# Patient Record
Sex: Male | Born: 1990 | Race: White | Hispanic: No | Marital: Married | Smoking: Current every day smoker
Health system: Southern US, Community
[De-identification: ages and names within clinical notes are randomized; demographics above are authoritative.]

---

## 2017-05-15 ENCOUNTER — Encounter (HOSPITAL_BASED_OUTPATIENT_CLINIC_OR_DEPARTMENT_OTHER): Payer: Self-pay | Admitting: Emergency Medicine

## 2017-05-15 ENCOUNTER — Emergency Department (HOSPITAL_BASED_OUTPATIENT_CLINIC_OR_DEPARTMENT_OTHER)
Admission: EM | Admit: 2017-05-15 | Discharge: 2017-05-15 | Disposition: A | Discharge status: Home / Self Care | Payer: Commercial Managed Care - PPO | Attending: Emergency Medicine | Admitting: Emergency Medicine

## 2017-05-15 DIAGNOSIS — L0231 Cutaneous abscess of buttock: Secondary | ICD-10-CM | POA: Insufficient documentation

## 2017-05-15 DIAGNOSIS — Z79899 Other long term (current) drug therapy: Secondary | ICD-10-CM | POA: Diagnosis not present

## 2017-05-15 DIAGNOSIS — F17200 Nicotine dependence, unspecified, uncomplicated: Secondary | ICD-10-CM | POA: Diagnosis not present

## 2017-05-15 MED ORDER — LIDOCAINE-EPINEPHRINE 2 %-1:100000 IJ SOLN
20.0000 mL | Freq: Once | INTRAMUSCULAR | Status: AC
  Administered 2017-05-15: 20 mL
  Filled 2017-05-15: qty 1

## 2017-05-15 NOTE — Discharge Instructions (Signed)
Warm compresses or soaks 4x a day and after bowel movement. Take abx until gone.

## 2017-05-15 NOTE — ED Triage Notes (Addendum)
Abscess to top of buttock, seen at [facility] and told told that it needed to be drained. Pt also started on abx.

## 2017-05-15 NOTE — ED Provider Notes (Signed)
[facility]   CSN: [id number] Arrival date & time: 05/15/17  0816     History   Chief Complaint Chief Complaint  Patient presents with  . Abscess    HPI Ryan Fischer is a 26 y.o. male.  26 yo M with a chief complaint of a abscess to his right buttock. Going on for the past week. Was seen in urgent care yesterday and they're concerned for a pilonidal abscess and told he needed to come to the emergency department for possible imaging or surgical consult. He has had a low-grade temperature yesterday. Otherwise denies fevers or nausea. The started warm compresses but felt like it made it worse.   The history is provided by the patient.  Abscess  Location:  Pelvis Pelvic abscess location:  R buttock Size:  3cm Abscess quality: induration, itching, painful and redness   Red streaking: no   Duration:  2 days Progression:  Worsening Pain details:    Quality:  Pressure, sharp and shooting   Severity:  Moderate   Duration:  1 week   Timing:  Constant   Progression:  Worsening Chronicity:  Recurrent Relieved by:  Nothing Worsened by:  Nothing Ineffective treatments:  None tried Associated symptoms: no fever, no headaches and no vomiting     History reviewed. No pertinent past medical history.  There are no active problems to display for this patient.   History reviewed. No pertinent surgical history.     Home Medications    Prior to Admission medications   Medication Sig Start Date End Date Taking? Authorizing Provider  cephALEXin (KEFLEX) 500 MG capsule Take 500 mg by mouth 4 (four) times daily.   Yes [provider]    Family History No family history on file.  Social History Social History  Substance Use Topics  . Smoking status: Current Every Day Smoker  . Smokeless tobacco: Never Used  . Alcohol use Not on file     Allergies   Patient has no known allergies.   Review of Systems Review of Systems    Constitutional: Negative for chills and fever.  HENT: Negative for congestion and facial swelling.   Eyes: Negative for discharge and visual disturbance.  Respiratory: Negative for shortness of breath.   Cardiovascular: Negative for chest pain and palpitations.  Gastrointestinal: Negative for abdominal pain, diarrhea and vomiting.  Musculoskeletal: Negative for arthralgias and myalgias.  Skin: Positive for color change and wound. Negative for rash.  Neurological: Negative for tremors, syncope and headaches.  Psychiatric/Behavioral: Negative for confusion and dysphoric mood.     Physical Exam Updated Vital Signs BP (!) 143/87 (BP Location: Left Arm)   Pulse 88   Temp 97.8 F (36.6 C) (Oral)   Resp 20   Ht 6\' 1"  (1.854 m)   Wt (!) 161.5 kg (356 lb)   SpO2 100%   BMI 46.97 kg/m   Physical Exam  Constitutional: He is oriented to person, place, and time. He appears well-developed and well-nourished.  HENT:  Head: Normocephalic and atraumatic.  Eyes: EOM are normal. Pupils are equal, round, and reactive to light.  Neck: Normal range of motion. Neck supple. No JVD present.  Cardiovascular: Normal rate and regular rhythm.  Exam reveals no gallop and no friction rub.   No murmur heard. Pulmonary/Chest: No respiratory distress. He has no wheezes.  Abdominal: He exhibits no distension and no mass. There is no tenderness. There is no rebound and no guarding.  Genitourinary:  Musculoskeletal: Normal range of motion.  Neurological: He is alert and oriented to person, place, and time.  Skin: No rash noted. No pallor.  Psychiatric: He has a normal mood and affect. His behavior is normal.  Nursing note and vitals reviewed.    ED Treatments / Results  Labs (all labs ordered are listed, but only abnormal results are displayed) Labs Reviewed - No data to display  EKG  EKG Interpretation None       Radiology No results found.  Procedures .Marland Kitchen.Incision and  Drainage Date/Time: 05/15/2017 8:58 AM Performed by: Adela LankFLOYD, Gaynelle Pastrana Authorized by: Melene PlanFLOYD, Willistine Ferrall   Consent:    Consent obtained:  Verbal   Consent given by:  Patient   Risks discussed:  Bleeding and incomplete drainage   Alternatives discussed:  No treatment Location:    Type:  Abscess   Size:  3   Location:  Anogenital   Anogenital location:  Gluteal cleft Pre-procedure details:    Skin preparation:  Chloraprep Anesthesia (see MAR for exact dosages):    Anesthesia method:  Local infiltration   Local anesthetic:  Lidocaine 2% WITH epi Procedure type:    Complexity:  Complex Procedure details:    Needle aspiration: no     Incision types:  Single straight   Incision depth:  Submucosal   Scalpel blade:  11   Wound management:  Probed and deloculated and extensive cleaning   Drainage:  Purulent and bloody   Drainage amount:  Copious   Wound treatment:  Wound left open   Packing materials:  None Post-procedure details:    Patient tolerance of procedure:  Tolerated well, no immediate complications   (including critical care time)  Medications Ordered in ED Medications  lidocaine-EPINEPHrine (XYLOCAINE W/EPI) 2 %-1:100000 (with pres) injection 20 mL (20 mLs Infiltration Given 05/15/17 0830)     Initial Impression / Assessment and Plan / ED Course  I have reviewed the triage vital signs and the nursing notes.  Pertinent labs & imaging results that were available during my care of the patient were reviewed by me and considered in my medical decision making (see chart for details).     26 yo M With a chief complaint of a abscess. This to his right buttock. He has some extension under the gluteal cleft. I&D at bedside. Some extensive tunneling across midline. For that reason I'll have him referred to general surgeon. He is oriented Keflex and Bactrim from his appointment yesterday. Discharge home.  8:59 AM:  I have discussed the diagnosis/risks/treatment options with the patient and  family and believe the pt to be eligible for discharge home to follow-up with Gen Surg. We also discussed returning to the ED immediately if new or worsening sx occur. We discussed the sx which are most concerning (e.g., sudden worsening pain, fever, inability to tolerate by mouth) that necessitate immediate return. Medications administered to the patient during their visit and any new prescriptions provided to the patient are listed below.  Medications given during this visit Medications  lidocaine-EPINEPHrine (XYLOCAINE W/EPI) 2 %-1:100000 (with pres) injection 20 mL (20 mLs Infiltration Given 05/15/17 0830)     The patient appears reasonably screen and/or stabilized for discharge and I doubt any other medical condition or other [facility] requiring further screening, evaluation, or treatment in the ED at this time prior to discharge.    Final Clinical Impressions(s) / ED Diagnoses   Final diagnoses:  Abscess of buttock, right    New Prescriptions New Prescriptions   No  medications on file     Melene Plan, [place] 05/15/17 1610

## 2017-05-15 NOTE — ED Notes (Signed)
I&D Tray at bedside. 

## 2020-08-19 ENCOUNTER — Emergency Department (HOSPITAL_BASED_OUTPATIENT_CLINIC_OR_DEPARTMENT_OTHER): Payer: Self-pay

## 2020-08-19 ENCOUNTER — Emergency Department (HOSPITAL_BASED_OUTPATIENT_CLINIC_OR_DEPARTMENT_OTHER)
Admission: EM | Admit: 2020-08-19 | Discharge: 2020-08-20 | Disposition: A | Discharge status: Home / Self Care | Payer: Self-pay | Attending: Emergency Medicine | Admitting: Emergency Medicine

## 2020-08-19 ENCOUNTER — Other Ambulatory Visit: Payer: Self-pay

## 2020-08-19 ENCOUNTER — Encounter (HOSPITAL_BASED_OUTPATIENT_CLINIC_OR_DEPARTMENT_OTHER): Payer: Self-pay | Admitting: *Deleted

## 2020-08-19 DIAGNOSIS — R0602 Shortness of breath: Secondary | ICD-10-CM | POA: Insufficient documentation

## 2020-08-19 DIAGNOSIS — F17210 Nicotine dependence, cigarettes, uncomplicated: Secondary | ICD-10-CM | POA: Insufficient documentation

## 2020-08-19 DIAGNOSIS — R072 Precordial pain: Secondary | ICD-10-CM

## 2020-08-19 DIAGNOSIS — R079 Chest pain, unspecified: Secondary | ICD-10-CM | POA: Insufficient documentation

## 2020-08-19 LAB — COMPREHENSIVE METABOLIC PANEL
ALT: 53 U/L — ABNORMAL HIGH (ref 0–44)
AST: 32 U/L (ref 15–41)
Albumin: 4.2 g/dL (ref 3.5–5.0)
Alkaline Phosphatase: 70 U/L (ref 38–126)
Anion gap: 12 (ref 5–15)
BUN: 11 mg/dL (ref 6–20)
CO2: 23 mmol/L (ref 22–32)
Calcium: 9.2 mg/dL (ref 8.9–10.3)
Chloride: 104 mmol/L (ref 98–111)
Creatinine, Ser: 1.05 mg/dL (ref 0.61–1.24)
GFR, Estimated: >60 mL/min (ref >60)
Glucose, Bld: 125 mg/dL — ABNORMAL HIGH (ref 70–99)
Potassium: 3.6 mmol/L (ref 3.5–5.1)
Sodium: 139 mmol/L (ref 135–145)
Total Bilirubin: 0.4 mg/dL (ref 0.3–1.2)
Total Protein: 7.8 g/dL (ref 6.5–8.1)

## 2020-08-19 LAB — CBC WITH DIFFERENTIAL/PLATELET
Abs Immature Granulocytes: 0.08 10*3/uL — ABNORMAL HIGH (ref 0.00–0.07)
Basophils Absolute: 0.1 10*3/uL (ref 0.0–0.1)
Basophils Relative: 0 %
Eosinophils Absolute: 0.1 10*3/uL (ref 0.0–0.5)
Eosinophils Relative: 1 %
HCT: 39.8 % (ref 39.0–52.0)
Hemoglobin: 13.3 g/dL (ref 13.0–17.0)
Immature Granulocytes: 1 %
Lymphocytes Relative: 28 %
Lymphs Abs: 3.9 10*3/uL (ref 0.7–4.0)
MCH: 28.2 pg (ref 26.0–34.0)
MCHC: 33.4 g/dL (ref 30.0–36.0)
MCV: 84.5 fL (ref 80.0–100.0)
Monocytes Absolute: 0.8 10*3/uL (ref 0.1–1.0)
Monocytes Relative: 6 %
Neutro Abs: 8.8 10*3/uL — ABNORMAL HIGH (ref 1.7–7.7)
Neutrophils Relative %: 64 %
Platelets: 337 10*3/uL (ref 150–400)
RBC: 4.71 MIL/uL (ref 4.22–5.81)
RDW: 13 % (ref 11.5–15.5)
WBC: 13.8 10*3/uL — ABNORMAL HIGH (ref 4.0–10.5)
nRBC: 0 % (ref 0.0–0.2)

## 2020-08-19 LAB — TROPONIN I (HIGH SENSITIVITY): Troponin I (High Sensitivity): 2 ng/L (ref <18)

## 2020-08-19 MED ORDER — ALUM & MAG HYDROXIDE-SIMETH 200-200-20 MG/5ML PO SUSP
30.0000 mL | Freq: Once | ORAL | Status: AC
  Administered 2020-08-19: 30 mL via ORAL
  Filled 2020-08-19: qty 30

## 2020-08-19 MED ORDER — IOHEXOL 350 MG/ML SOLN
100.0000 mL | Freq: Once | INTRAVENOUS | Status: AC | PRN
  Administered 2020-08-19: 100 mL via INTRAVENOUS

## 2020-08-19 NOTE — ED Triage Notes (Addendum)
C/o cp and SOB  x 3 days " on and off" increased stress at work and at home, recent death in family

## 2020-08-20 ENCOUNTER — Encounter (HOSPITAL_BASED_OUTPATIENT_CLINIC_OR_DEPARTMENT_OTHER): Payer: Self-pay | Admitting: Emergency Medicine

## 2020-08-20 LAB — D-DIMER, QUANTITATIVE: D-Dimer, Quant: 0.51 ug/mL-FEU — ABNORMAL HIGH (ref 0.00–0.50)

## 2020-08-20 MED ORDER — KETOROLAC TROMETHAMINE 30 MG/ML IJ SOLN
30.0000 mg | Freq: Once | INTRAMUSCULAR | Status: AC
  Administered 2020-08-20: 30 mg via INTRAVENOUS
  Filled 2020-08-20: qty 1

## 2020-08-20 NOTE — ED Provider Notes (Signed)
[facility] EMERGENCY DEPARTMENT Provider Note   CSN: [id number] Arrival date & time: 08/19/20  2127     History Chief Complaint  Patient presents with  . Chest Pain    Ryan Fischer is a 29 y.o. male.  The history is provided by the patient.  Chest Pain Pain location:  Substernal area Pain quality: sharp   Pain radiates to:  Does not radiate Pain severity:  Moderate Onset quality:  Gradual Duration:  3 days (intermittent on day 1-2 and now constant x 24 hours.  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  Not pleuritic.  No DOE.  No exertional symptoms.  No n/v/d.  No leg pain or swelling.  No leg pain or swelling no travel.       History reviewed. No pertinent past medical history.  There are no problems to display for this patient.   History reviewed. No pertinent surgical history.     History reviewed. No pertinent family history.  Social History   Tobacco Use  . Smoking status: Current Every Day Smoker    Packs/day: 0.50  . Smokeless tobacco: Never Used  Substance Use Topics  . Alcohol use: Not Currently  . Drug use: No    Home Medications Prior to Admission medications   Medication Sig Start Date End Date Taking? Authorizing Provider  cephALEXin (KEFLEX) 500 MG capsule Take 500 mg by mouth 4 (four) times daily.    [provider]    Allergies    Patient has no known allergies.  Review of Systems   Review of Systems  Constitutional: Negative for fever.  HENT: Negative for  congestion.   Eyes: Negative for visual disturbance.  Respiratory: Negative for cough.   Cardiovascular: Positive for chest pain. Negative for palpitations and leg swelling.  Gastrointestinal: Negative for abdominal pain.  Genitourinary: Negative for difficulty urinating.  Musculoskeletal: Negative for arthralgias.  Neurological: Negative for dizziness.  Psychiatric/Behavioral: The patient is nervous/anxious.   All other systems reviewed and are negative.   Physical Exam Updated Vital Signs BP 129/83 (BP Location: Right Arm)   Pulse 96   Temp 98.5 F (36.9 C) (Oral)   Resp 19   Ht 6\' 1"  (1.854 m)   Wt (!) 163.3 kg   SpO2 100%   BMI 47.50 kg/m   Physical Exam Vitals and nursing note reviewed.  Constitutional:      General: He is not in acute distress.    Appearance: Normal appearance.  HENT:     Head: Normocephalic and atraumatic.     Nose: Nose normal.  Eyes:     Conjunctiva/sclera: Conjunctivae normal.     Pupils: Pupils are equal, round, and reactive to light.  Cardiovascular:     Rate and Rhythm: Normal rate and regular rhythm.     Pulses: Normal pulses.     Heart sounds: Normal heart sounds.  Pulmonary:     Effort: Pulmonary effort is normal.     Breath sounds: Normal breath sounds.  Abdominal:     General: Abdomen is flat. Bowel sounds are normal.     Palpations: Abdomen is soft.     Tenderness: There is no abdominal tenderness. There is no guarding.  Musculoskeletal:        General: No tenderness. Normal range of motion.     Cervical back: Normal range of motion and neck supple.     Right lower leg: No edema.     Left lower leg: No edema.  Skin:    General: Skin is warm and dry.     Capillary Refill: Capillary refill takes less than 2 seconds.  Neurological:     General: No focal deficit present.     Mental Status: He is alert and oriented to person, place, and time.     Deep Tendon Reflexes: Reflexes normal.  Psychiatric:        Mood and Affect: Mood  is anxious.     ED Results / Procedures / Treatments   Labs (all labs ordered are listed, but only abnormal results are displayed) Results for orders placed or performed during the hospital encounter of 08/19/20  CBC with Differential  Result Value Ref Range   WBC 13.8 (H) 4.0 - 10.5 K/uL   RBC 4.71 4.22 - 5.81 MIL/uL   Hemoglobin 13.3 13.0 - 17.0 g/dL   HCT 60.1 39 - 52 %   MCV 84.5 80.0 - 100.0 fL   MCH 28.2 26.0 - 34.0 pg   MCHC 33.4 30.0 - 36.0 g/dL   RDW 09.3 23.5 - 57.3 %   Platelets 337 150 - 400 K/uL   nRBC 0.0 0.0 - 0.2 %   Neutrophils Relative % 64 %   Neutro Abs 8.8 (H) 1.7 - 7.7 K/uL   Lymphocytes Relative 28 %   Lymphs Abs 3.9 0.7 - 4.0 K/uL   Monocytes Relative 6 %   Monocytes Absolute 0.8 0.1 - 1.0 K/uL   Eosinophils Relative 1 %   Eosinophils Absolute 0.1 0 - 0 K/uL   Basophils Relative 0 %   Basophils Absolute 0.1 0 - 0 K/uL   Immature Granulocytes 1 %   Abs Immature Granulocytes 0.08 (H) 0.00 - 0.07 K/uL  Comprehensive metabolic panel  Result Value Ref Range   Sodium 139 135 - 145 mmol/L   Potassium 3.6 3.5 - 5.1 mmol/L   Chloride 104 98 - 111 mmol/L   CO2 23 22 - 32 mmol/L   Glucose, Bld 125 (H) 70 - 99 mg/dL   BUN 11 6 - 20 mg/dL   Creatinine, Ser 2.20 0.61 - 1.24 mg/dL   Calcium 9.2 8.9 - 25.4 mg/dL   Total Protein 7.8 6.5 - 8.1 g/dL   Albumin 4.2 3.5 - 5.0 g/dL   AST 32 15 - 41 U/L   ALT 53 (H) 0 - 44 U/L   Alkaline Phosphatase 70 38 - 126 U/L   Total Bilirubin 0.4 0.3 - 1.2 mg/dL   GFR, Estimated >27 >06 mL/min   Anion gap 12 5 - 15  D-dimer, quantitative (not at [facility])  Result Value Ref Range   D-Dimer, Quant 0.51 (H) 0.00 - 0.50 ug/mL-FEU  Troponin I (High Sensitivity)  Result Value Ref Range   Troponin I (High Sensitivity) 2 <18 ng/L   DG Chest 2 View  Result Date: 08/19/2020 CLINICAL DATA:  Chest pain EXAM: CHEST - 2 VIEW COMPARISON:  None. FINDINGS: The heart size and mediastinal contours are within normal limits. Both lungs are  clear.  The visualized skeletal structures are unremarkable. IMPRESSION: No active cardiopulmonary disease. Electronically Signed   By: Helyn Numbers MD   On: 08/19/2020 22:57   CT Angio Chest PE W and/or Wo Contrast  Result Date: 08/19/2020 CLINICAL DATA:  29 year old male with chest pain and shortness of breath. EXAM: CT ANGIOGRAPHY CHEST WITH CONTRAST TECHNIQUE: Multidetector CT imaging of the chest was performed using the standard protocol during bolus administration of intravenous contrast. Multiplanar CT image reconstructions and MIPs were obtained to evaluate the vascular anatomy. CONTRAST:  OMNIPAQUE IOHEXOL 350 MG/ML SOLN COMPARISON:  Chest radiograph dated 08/19/2020. FINDINGS: Cardiovascular: There is no cardiomegaly or pericardial effusion. The thoracic aorta is unremarkable. Evaluation of the pulmonary arteries is very limited due to suboptimal opacification and timing of the contrast. No large or central pulmonary artery embolus identified. V/Q scan may provide better evaluation if there is high clinical concern for acute PE. Mediastinum/Nodes: There is no hilar or mediastinal adenopathy. The esophagus is grossly unremarkable. No mediastinal fluid collection. Lungs/Pleura: The lungs are clear. There is no pleural effusion or pneumothorax. The central airways are patent. Upper Abdomen: Fatty liver. Musculoskeletal: No chest wall abnormality. No acute or significant osseous findings. Review of the MIP images confirms the above findings. IMPRESSION: 1. No acute intrathoracic pathology. No CT evidence of central pulmonary artery embolus. V/Q scan may provide better evaluation if there is high clinical concern for acute PE. 2. Fatty liver. Electronically Signed   By: Elgie Collard M.D.   On: 08/19/2020 23:30    EKG See muse   Radiology DG Chest 2 View  Result Date: 08/19/2020 CLINICAL DATA:  Chest pain EXAM: CHEST - 2 VIEW COMPARISON:  None. FINDINGS: The heart size and mediastinal  contours are within normal limits. Both lungs are clear. The visualized skeletal structures are unremarkable. IMPRESSION: No active cardiopulmonary disease. Electronically Signed   By: Helyn Numbers MD   On: 08/19/2020 22:57   CT Angio Chest PE W and/or Wo Contrast  Result Date: 08/19/2020 CLINICAL DATA:  29 year old male with chest pain and shortness of breath. EXAM: CT ANGIOGRAPHY CHEST WITH CONTRAST TECHNIQUE: Multidetector CT imaging of the chest was performed using the standard protocol during bolus administration of intravenous contrast. Multiplanar CT image reconstructions and MIPs were obtained to evaluate the vascular anatomy. CONTRAST:  OMNIPAQUE IOHEXOL 350 MG/ML SOLN COMPARISON:  Chest radiograph dated 08/19/2020. FINDINGS: Cardiovascular: There is no cardiomegaly or pericardial effusion. The thoracic aorta is unremarkable. Evaluation of the pulmonary arteries is very limited due to suboptimal opacification and timing of the contrast. No large or central pulmonary artery embolus identified. V/Q scan may provide better evaluation if there is high clinical concern for acute PE. Mediastinum/Nodes: There is no hilar or mediastinal adenopathy. The esophagus is grossly unremarkable. No mediastinal fluid collection. Lungs/Pleura: The lungs are clear. There is no pleural effusion or pneumothorax. The central airways are patent. Upper Abdomen: Fatty liver. Musculoskeletal: No chest wall abnormality. No acute or significant osseous findings. Review of the MIP images confirms the above findings. IMPRESSION: 1. No acute intrathoracic pathology. No CT evidence of central pulmonary artery embolus. V/Q scan may provide better evaluation if there is high clinical concern for acute PE. 2. Fatty liver. Electronically Signed   By: Elgie Collard M.D.   On: 08/19/2020 23:30    Procedures Procedures (including critical care time)  Medications Ordered in ED Medications  iohexol (OMNIPAQUE) 350 MG/ML  injection 100 mL (100 mLs Intravenous Contrast Given 08/19/20 2314)  alum & mag hydroxide-simeth (MAALOX/MYLANTA) 200-200-20 MG/5ML suspension 30 mL (30 mLs Oral Given 08/19/20 2336)  ketorolac (TORADOL) 30 MG/ML injection 30 mg (30 mg Intravenous Given 08/20/20 0041)    ED Course  I have reviewed the triage vital signs and the nursing notes.  Pertinent labs & imaging results that were available during my care of the patient were reviewed by me and considered in my medical decision making (see chart for details).    Low suspicion for PE but tachycardia so proceeded with CTA.  CTA was sub-optimal and it was followed by ddimer and together this is reassuring for not being PE.  Based on 24 hours of ongoing symptoms a negative ekg and troponin rules out ACS.  HEART score is 1 very low risk for mace.  I suspect this is anxiety.  I will have the patient follow up with PMD for ongoing care.    Ryan Fischer was evaluated in Emergency Department on 08/20/2020 for the symptoms described in the history of present illness. He was evaluated in the context of the global COVID-19 pandemic, which necessitated consideration that the patient might be at risk for infection with the SARS-CoV-2 virus that causes COVID-19. Institutional protocols and algorithms that pertain to the evaluation of patients at risk for COVID-19 are in a state of rapid change based on information released by regulatory bodies including the CDC and federal and state organizations. These policies and algorithms were followed during the patient's care in the ED.  Final Clinical Impression(s) / ED Diagnoses   Return for intractable cough, coughing up blood,fevers >100.4 unrelieved by medication, shortness of breath, intractable vomiting, chest pain, shortness of breath, weakness,numbness, changes in speech, facial asymmetry,abdominal pain, passing out,Inability to tolerate liquids or food, cough, altered mental status or any concerns.  No signs of systemic illness or infection. The patient is nontoxic-appearing on exam and vital signs are within normal limits.   I have reviewed the triage vital signs and the nursing notes. Pertinent labs &imaging results that were available during my care of the patient were reviewed by me and considered in my medical decision making (see chart for details).After history, exam, and medical workup I feel the patient has beenappropriately medically screened and is safe for discharge home. Pertinent diagnoses were discussed with the patient. Patient was given return precautions.     Hagen Bohorquez, MD 08/20/20 0120

## 2021-05-10 IMAGING — CR DG CHEST 2V
2 series · 2 of 2 positions shown · non-contrast
Comparison: None.

CLINICAL DATA: Chest pain

EXAM:
CHEST - 2 VIEW

[w chest pa]
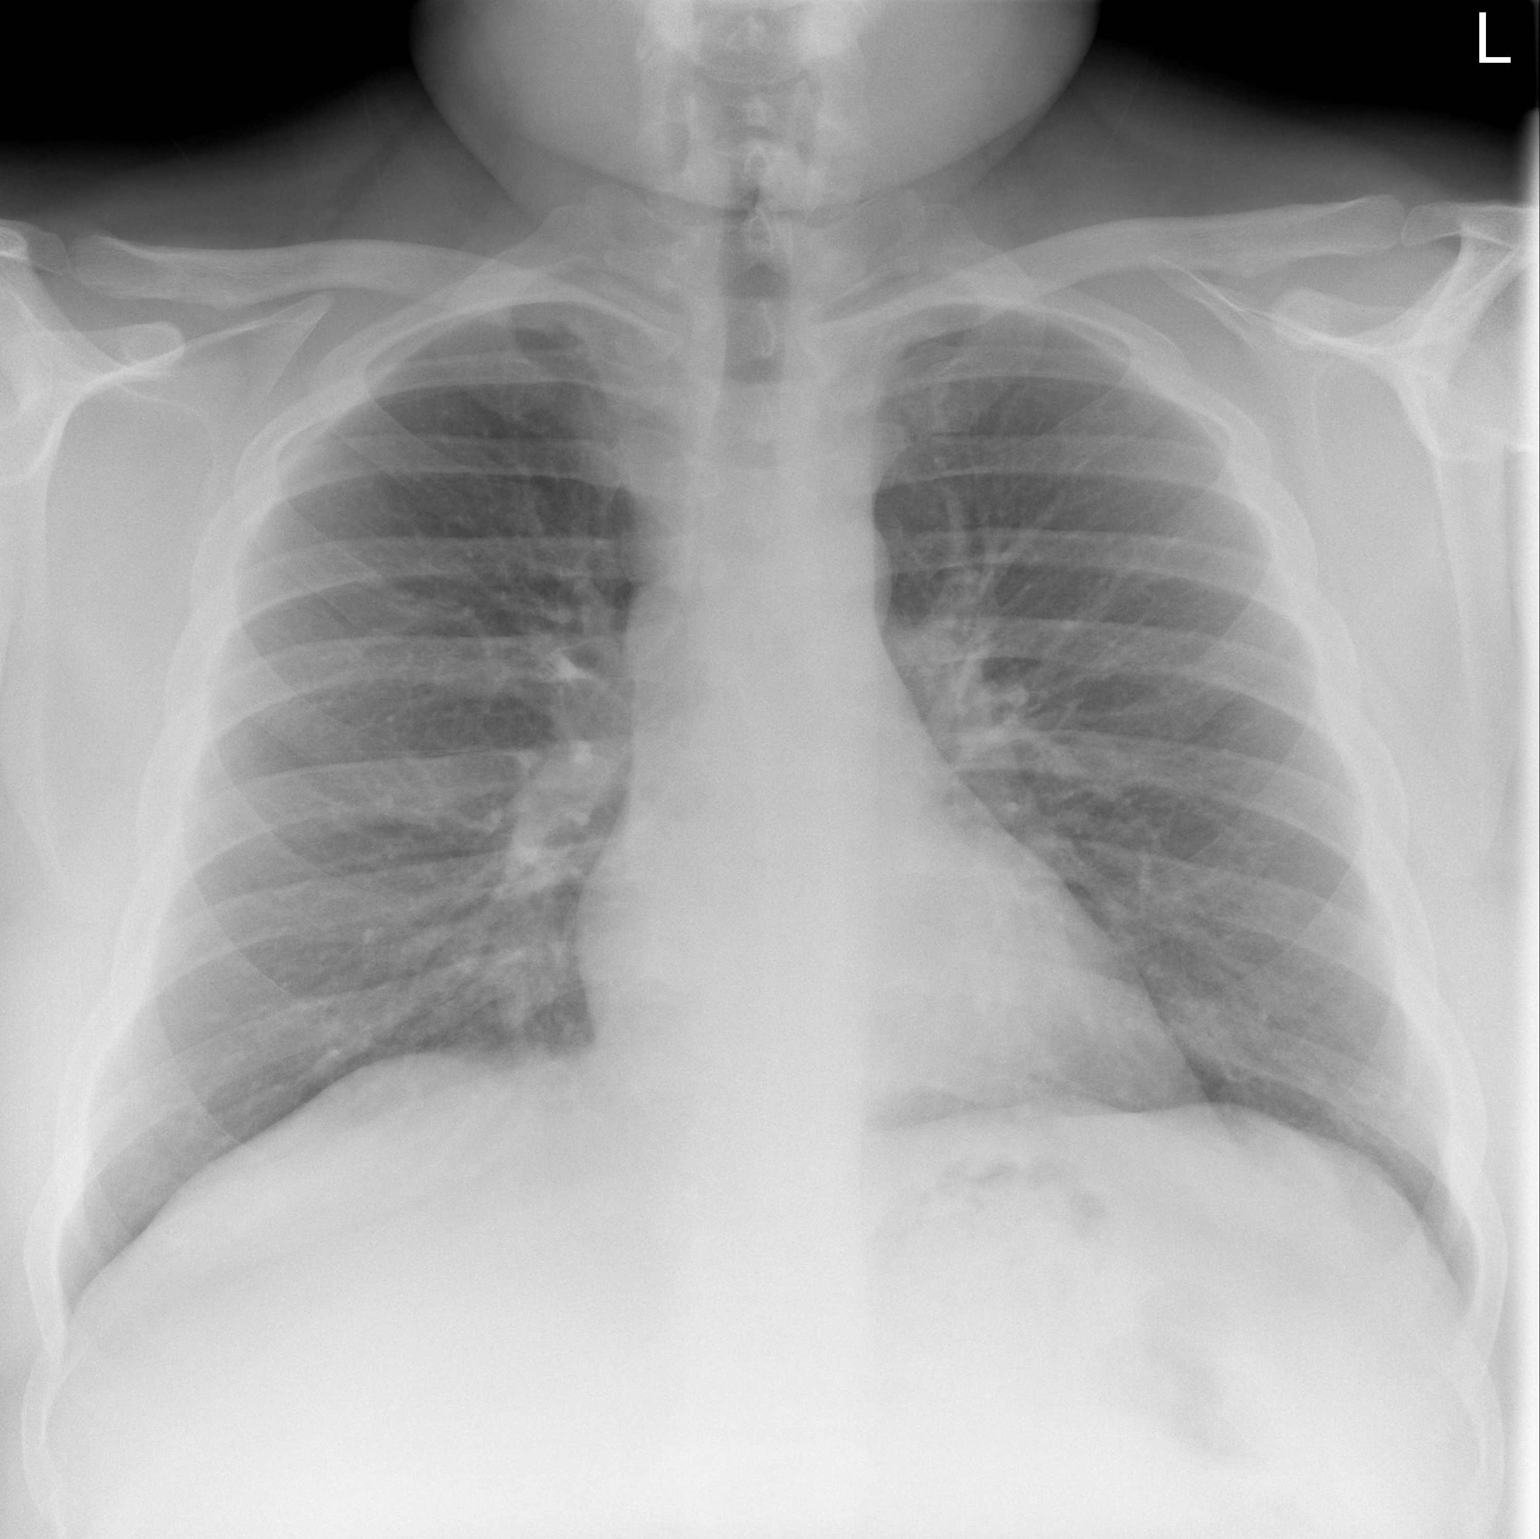

[w chest lat]
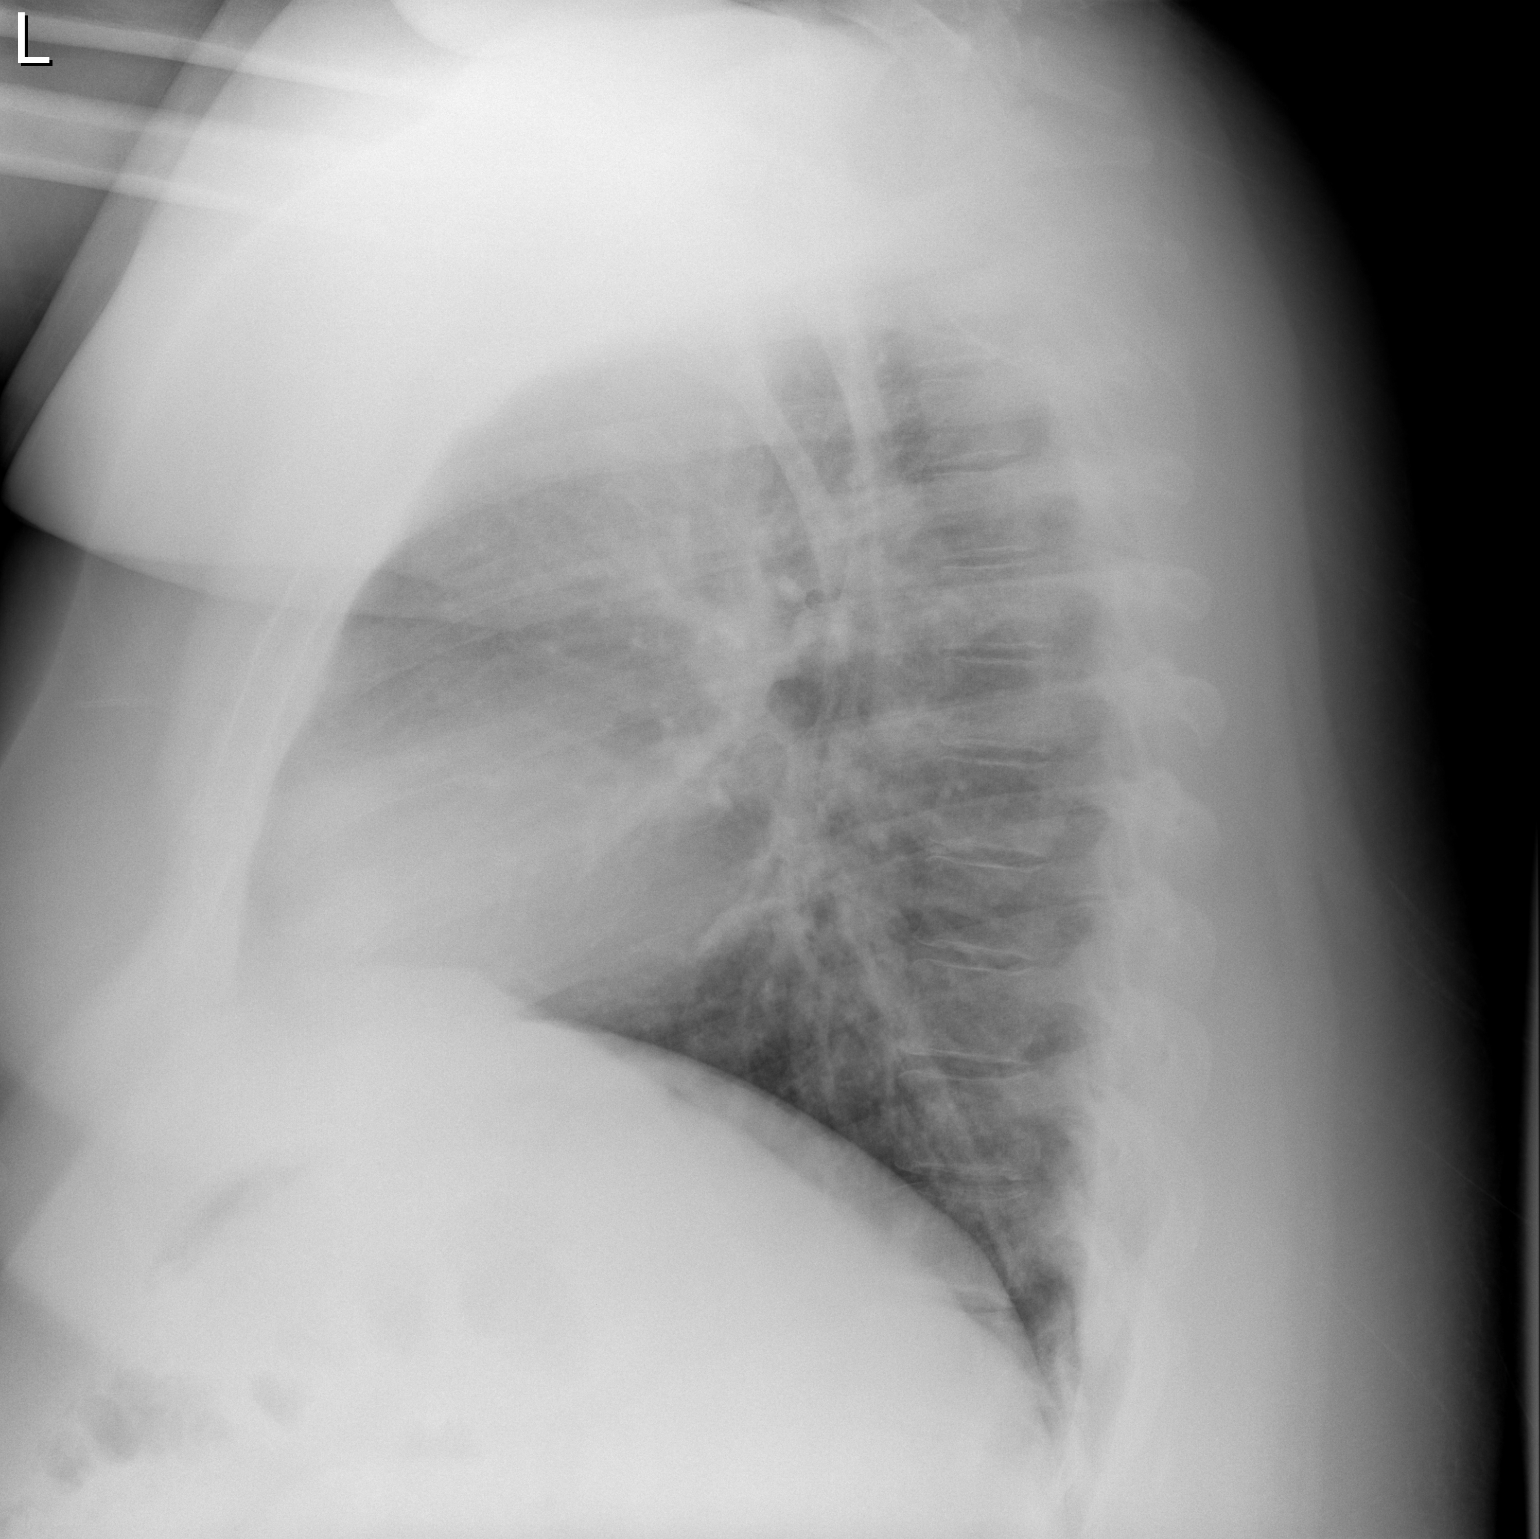

[2 of 2 positions shown; findings below may reference images not displayed]

FINDINGS: The heart size and mediastinal contours are within normal limits.
Both lungs are clear. The visualized skeletal structures are
unremarkable.
IMPRESSION: No active cardiopulmonary disease.

## 2021-05-10 IMAGING — CT CT ANGIO CHEST
2 of 8 series · 18 of 36 positions shown · IV contrast (Omnipaque)
Comparison: Chest radiograph dated 08/19/2020.

CLINICAL DATA: 29-year-old male with chest pain and shortness of
breath.

EXAM:
CT ANGIOGRAPHY CHEST WITH CONTRAST
TECHNIQUE: Multidetector CT imaging of the chest was performed using the
standard protocol during bolus administration of intravenous
contrast. Multiplanar CT image reconstructions and MIPs were
obtained to evaluate the vascular anatomy.
CONTRAST:  100mL OMNIPAQUE IOHEXOL 350 MG/ML SOLN

[Series 6: pe thins · axial · 0.82mm/px · z∈[-149,+107]mm · 17 of 377 slices shown]
[im 18/377  lung]
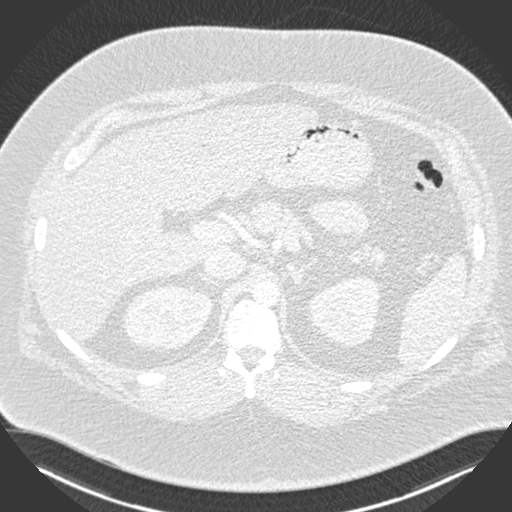
[im 36/377  mediastinal]
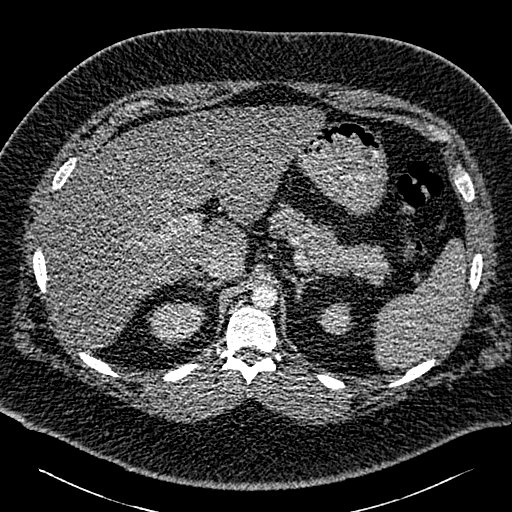
[im 54/377  lung]
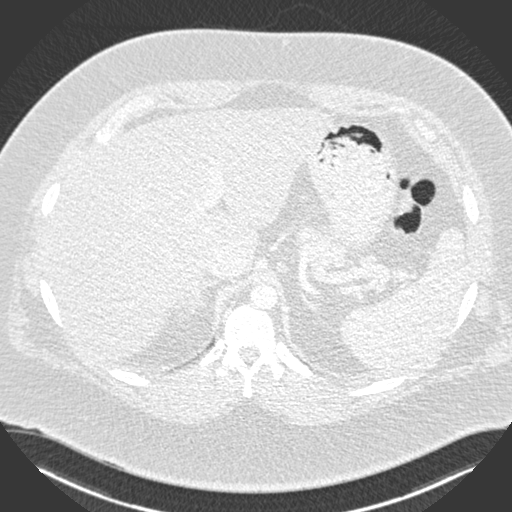
[im 90/377  mediastinal]
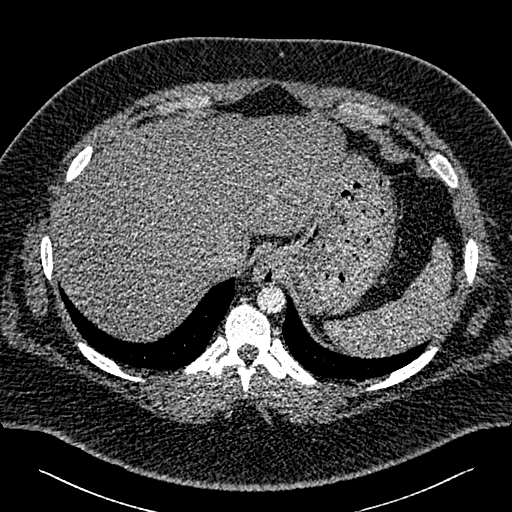
[im 108/377  lung]
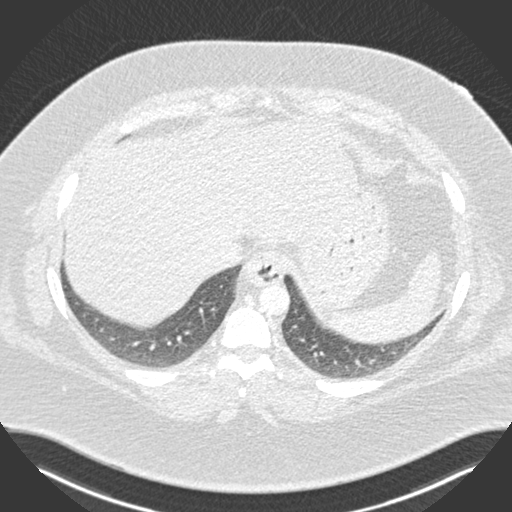
[im 126/377  mediastinal]
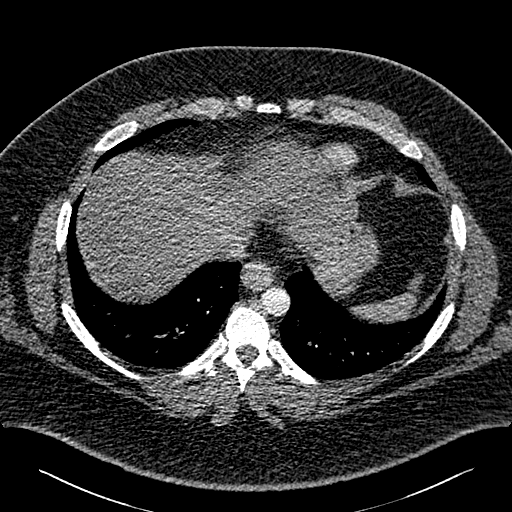
[im 144/377  lung]
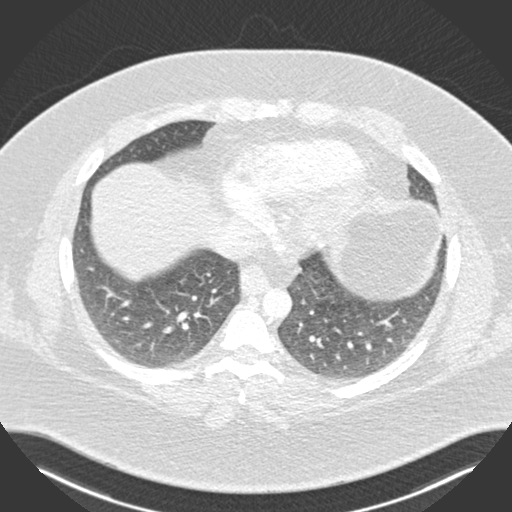
[im 162/377  mediastinal]
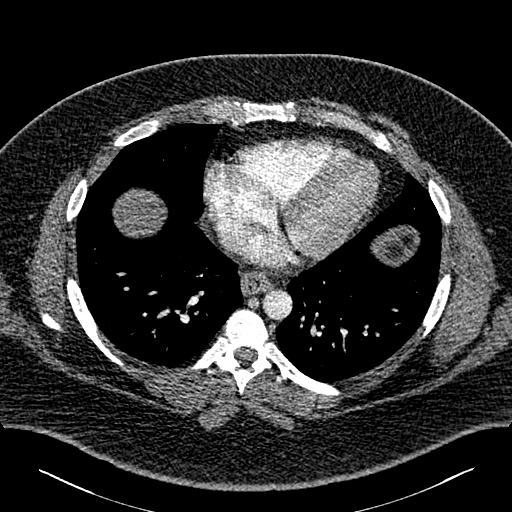
[im 197/377  lung]
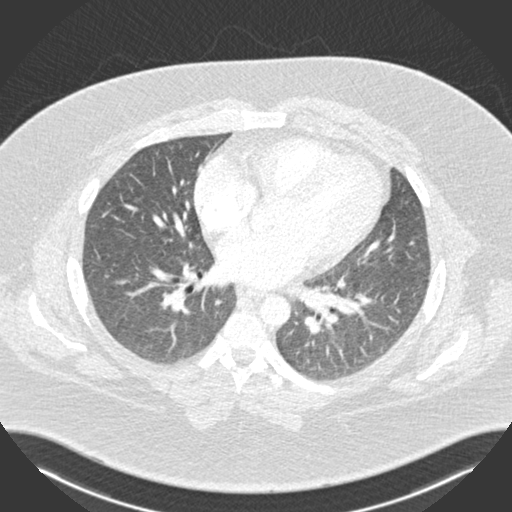
[im 215/377  mediastinal]
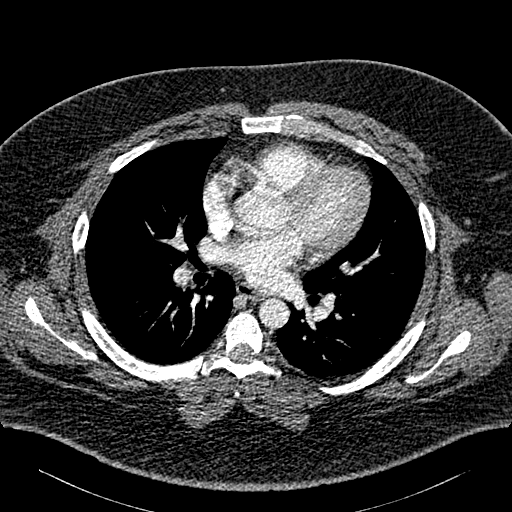
[im 233/377  lung]
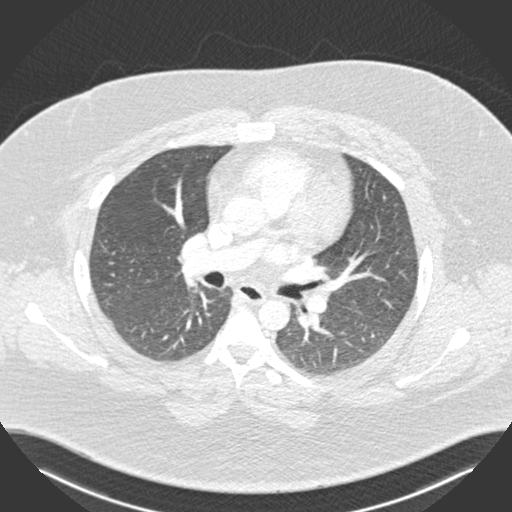
[im 251/377  mediastinal]
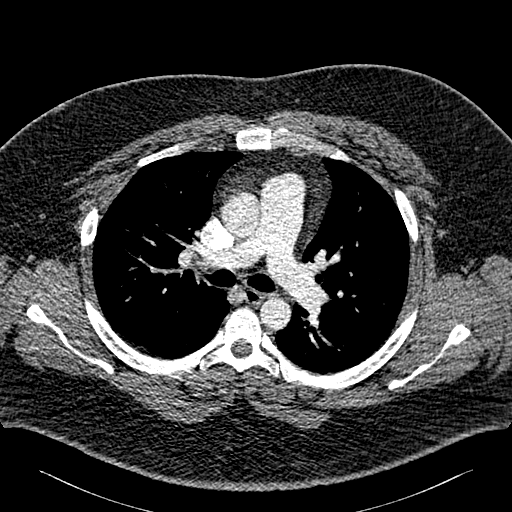
[im 269/377  lung]
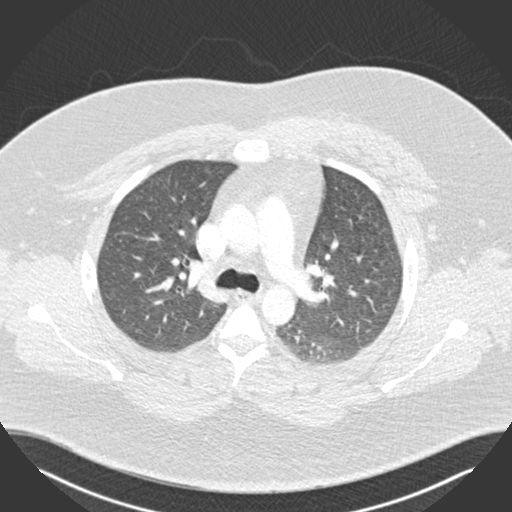
[im 287/377  mediastinal]
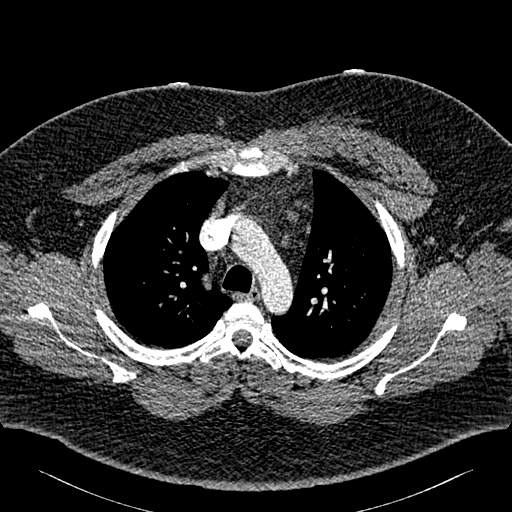
[im 323/377  lung]
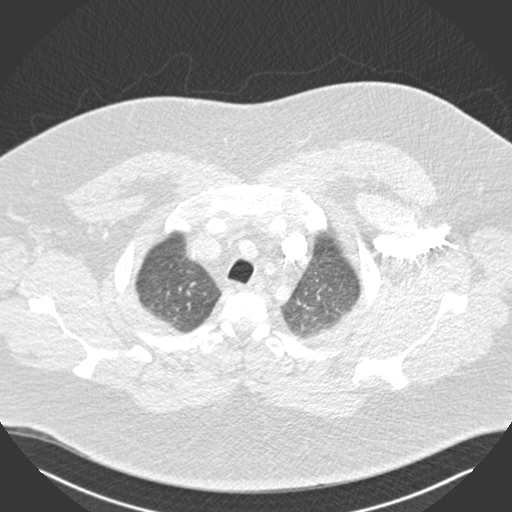
[im 341/377  mediastinal]
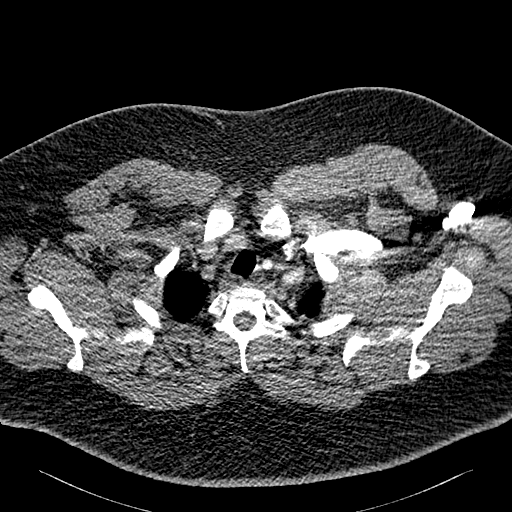
[im 359/377  lung]
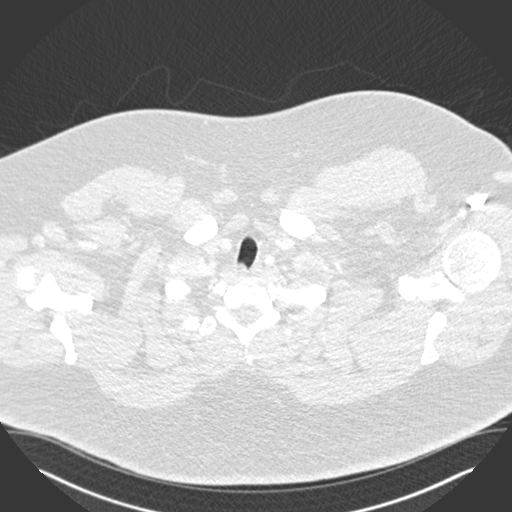

[Series 7: pe coronal mpr · coronal · 0.59mm/px · 1 of 98 slices shown]
[im 49/98  mediastinal]
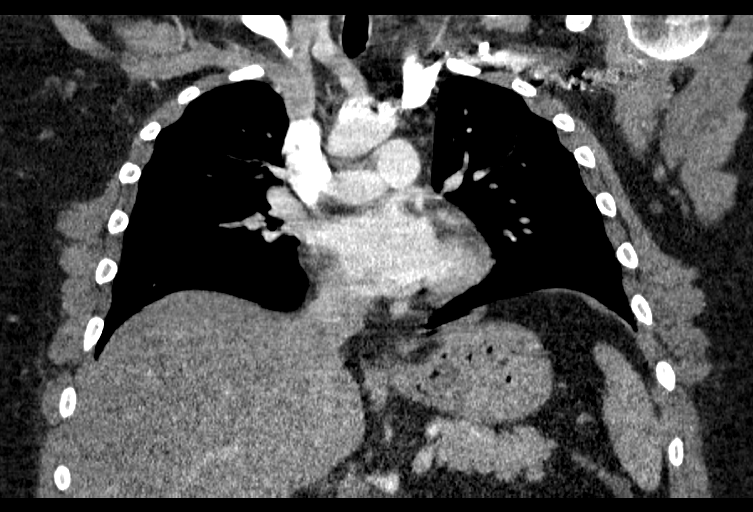

[18 of 36 positions shown; findings below may reference images not displayed]

FINDINGS: Cardiovascular: There is no cardiomegaly or pericardial effusion.
The thoracic aorta is unremarkable. Evaluation of the pulmonary
arteries is very limited due to suboptimal opacification and timing
of the contrast. No large or central pulmonary artery embolus
identified. V/Q scan may provide better evaluation if there is high
clinical concern for acute PE.

Mediastinum/Nodes: There is no hilar or mediastinal adenopathy. The
esophagus is grossly unremarkable. No mediastinal fluid collection.

Lungs/Pleura: The lungs are clear. There is no pleural effusion or
pneumothorax. The central airways are patent.

Upper Abdomen: Fatty liver.

Musculoskeletal: No chest wall abnormality. No acute or significant
osseous findings.

Review of the MIP images confirms the above findings.
IMPRESSION: 1. No acute intrathoracic pathology. No CT evidence of central
pulmonary artery embolus. V/Q scan may provide better evaluation if
there is high clinical concern for acute PE.
2. Fatty liver.
